# Patient Record
Sex: Female | Born: 1985 | Race: Black or African American | Hispanic: No | Marital: Single | Smoking: Current every day smoker
Health system: Southern US, Community
[De-identification: ages and names within clinical notes are randomized; demographics above are authoritative.]

## PROBLEM LIST (undated history)

## (undated) DIAGNOSIS — I10 Essential (primary) hypertension: Secondary | ICD-10-CM

## (undated) HISTORY — PX: NO PAST SURGERIES: SHX2092

---

## 2015-09-12 ENCOUNTER — Other Ambulatory Visit (INDEPENDENT_AMBULATORY_CARE_PROVIDER_SITE_OTHER): Payer: Self-pay | Admitting: Otolaryngology

## 2015-09-12 DIAGNOSIS — J0101 Acute recurrent maxillary sinusitis: Secondary | ICD-10-CM

## 2015-09-18 ENCOUNTER — Ambulatory Visit
Admission: RE | Admit: 2015-09-18 | Discharge: 2015-09-18 | Disposition: A | Discharge status: Home / Self Care | Payer: 59 | Source: Ambulatory Visit | Attending: Otolaryngology | Admitting: Otolaryngology

## 2015-09-18 DIAGNOSIS — J0101 Acute recurrent maxillary sinusitis: Secondary | ICD-10-CM

## 2015-10-08 ENCOUNTER — Emergency Department (HOSPITAL_COMMUNITY)
Admission: EM | Admit: 2015-10-08 | Discharge: 2015-10-08 | Disposition: A | Discharge status: Home / Self Care | Payer: 59 | Attending: Emergency Medicine | Admitting: Emergency Medicine

## 2015-10-08 ENCOUNTER — Emergency Department (HOSPITAL_COMMUNITY): Payer: 59

## 2015-10-08 ENCOUNTER — Encounter (HOSPITAL_COMMUNITY): Payer: Self-pay | Admitting: Emergency Medicine

## 2015-10-08 DIAGNOSIS — R079 Chest pain, unspecified: Secondary | ICD-10-CM | POA: Diagnosis present

## 2015-10-08 DIAGNOSIS — Z3202 Encounter for pregnancy test, result negative: Secondary | ICD-10-CM | POA: Insufficient documentation

## 2015-10-08 DIAGNOSIS — Z7982 Long term (current) use of aspirin: Secondary | ICD-10-CM | POA: Diagnosis not present

## 2015-10-08 DIAGNOSIS — J189 Pneumonia, unspecified organism: Secondary | ICD-10-CM

## 2015-10-08 DIAGNOSIS — I10 Essential (primary) hypertension: Secondary | ICD-10-CM | POA: Insufficient documentation

## 2015-10-08 DIAGNOSIS — Z87891 Personal history of nicotine dependence: Secondary | ICD-10-CM | POA: Insufficient documentation

## 2015-10-08 DIAGNOSIS — J159 Unspecified bacterial pneumonia: Secondary | ICD-10-CM | POA: Diagnosis not present

## 2015-10-08 HISTORY — DX: Essential (primary) hypertension: I10

## 2015-10-08 LAB — URINALYSIS, ROUTINE W REFLEX MICROSCOPIC
Bilirubin Urine: NEGATIVE
Glucose, UA: NEGATIVE mg/dL
Ketones, ur: NEGATIVE mg/dL
Leukocytes, UA: NEGATIVE
Nitrite: NEGATIVE
Protein, ur: NEGATIVE mg/dL
Specific Gravity, Urine: 1.021 (ref 1.005–1.030)
pH: 6 (ref 5.0–8.0)

## 2015-10-08 LAB — BASIC METABOLIC PANEL
Anion gap: 8 (ref 5–15)
BUN: 12 mg/dL (ref 6–20)
CALCIUM: 9.5 mg/dL (ref 8.9–10.3)
CO2: 26 mmol/L (ref 22–32)
CREATININE: 0.82 mg/dL (ref 0.44–1.00)
Chloride: 107 mmol/L (ref 101–111)
GFR calc Af Amer: >60 mL/min (ref >60)
GFR calc non Af Amer: >60 mL/min (ref >60)
GLUCOSE: 103 mg/dL — AB (ref 65–99)
Potassium: 4 mmol/L (ref 3.5–5.1)
Sodium: 141 mmol/L (ref 135–145)

## 2015-10-08 LAB — URINE MICROSCOPIC-ADD ON

## 2015-10-08 LAB — CBC
HCT: 38.4 % (ref 36.0–46.0)
Hemoglobin: 12.9 g/dL (ref 12.0–15.0)
MCH: 28.9 pg (ref 26.0–34.0)
MCHC: 33.6 g/dL (ref 30.0–36.0)
MCV: 85.9 fL (ref 78.0–100.0)
PLATELETS: 327 10*3/uL (ref 150–400)
RBC: 4.47 MIL/uL (ref 3.87–5.11)
RDW: 12.8 % (ref 11.5–15.5)
WBC: 6.5 10*3/uL (ref 4.0–10.5)

## 2015-10-08 LAB — I-STAT TROPONIN, ED: TROPONIN I, POC: 0 ng/mL (ref 0.00–0.08)

## 2015-10-08 LAB — D-DIMER, QUANTITATIVE: D-Dimer, Quant: 0.31 ug/mL-FEU (ref 0.00–0.50)

## 2015-10-08 LAB — PREGNANCY, URINE: Preg Test, Ur: NEGATIVE

## 2015-10-08 MED ORDER — KETOROLAC TROMETHAMINE 30 MG/ML IJ SOLN
30.0000 mg | Freq: Once | INTRAMUSCULAR | Status: AC
  Administered 2015-10-08: 30 mg via INTRAVENOUS
  Filled 2015-10-08: qty 1

## 2015-10-08 MED ORDER — DEXTROSE 5 % IV SOLN
1.0000 g | Freq: Once | INTRAVENOUS | Status: AC
  Administered 2015-10-08: 1 g via INTRAVENOUS
  Filled 2015-10-08: qty 10

## 2015-10-08 MED ORDER — HYDROCODONE-ACETAMINOPHEN 5-325 MG PO TABS: 1.0000 | ORAL_TABLET | Freq: Four times a day (QID) | ORAL | Status: DC | PRN

## 2015-10-08 MED ORDER — AZITHROMYCIN 250 MG PO TABS: ORAL_TABLET | ORAL | Status: AC

## 2015-10-08 MED ORDER — SODIUM CHLORIDE 0.9 % IV BOLUS (SEPSIS)
1000.0000 mL | Freq: Once | INTRAVENOUS | Status: AC
  Administered 2015-10-08: 1000 mL via INTRAVENOUS

## 2015-10-08 NOTE — Discharge Instructions (Signed)
Return here as needed.  Follow-up with the resources provided. °

## 2015-10-08 NOTE — ED Notes (Signed)
Unable to collect lab at this time PT taken to xray

## 2015-10-08 NOTE — ED Notes (Signed)
Pt c/o hypertension, chest tightness, and headache, has been waking up with chest pains, had BP checked and saw it was 156/108.

## 2015-10-08 NOTE — Progress Notes (Signed)
Entered in d/c instructions  Please use the resources provided to you in emergency room by case manager to assist with doctor for follow up  Please verify any provider recommended to you is in network

## 2015-10-08 NOTE — Progress Notes (Addendum)
Pt states she has "just moved to the area" and without a pcp for follow up care Cm provided her with a list of accepting [employer] [facility] choice plus providers  [facility] ED CM spoke with pt on how to obtain an in network pcp with insurance coverage via the customer service number or web site  Cm reviewed ED level of care for crisis/emergent services and community pcp level of care to manage continuous or chronic medical concerns.  The pt voiced understanding CM encouraged pt and discussed pt's responsibility to verify with pt's insurance carrier that any recommended medical provider offered by any emergency room or a hospital provider is within the carrier's network. The pt voiced understanding     Provided pt with a 15 page list of providers in her new zip code Pt very appreciative

## 2015-10-08 NOTE — ED Provider Notes (Signed)
CSN: [id number]     Arrival date & time 10/08/15  1101 History   First MD Initiated Contact with Patient 10/08/15 1223     Chief Complaint  Patient presents with  . Hypertension  . Chest Pain  . Headache     (Consider location/radiation/quality/duration/timing/severity/associated sxs/prior Treatment) HPI Patient presents to the emergency department with 2 weeks' worth of chest discomfort intermittently along with headache.  The patient states that her blood pressure has been up for quite a while, but is unsure of how long patient states that nothing seems make the condition better.  She states that movement, coughing, laughing and palpation make the chest pain worse.  She states that it sharp in nature, does not radiate.  She states she does not have any diaphoresis, nausea, vomiting, weakness, dizziness, blurred vision, back pain, neck pain, fever, abdominal pain, dysuria, incontinence, fever, cough, near syncope or syncope Past Medical History  Diagnosis Date  . Hypertension    History reviewed. No pertinent past surgical history. History reviewed. No pertinent family history. Social History  Substance Use Topics  . Smoking status: Former Research scientist (life sciences)  . Smokeless tobacco: None  . Alcohol Use: Yes     Comment: occasional   OB History    No data available     Review of Systems  All other systems negative except as documented in the HPI. All pertinent positives and negatives as reviewed in the HPI.  Allergies  Review of patient's allergies indicates no known allergies.  Home Medications   Prior to Admission medications   Medication Sig Start Date End Date Taking? Authorizing Provider  aspirin EC 81 MG tablet Take 81 mg by mouth daily as needed for mild pain.   Yes Historical Provider, MD  levonorgestrel (MIRENA, 52 MG,) 20 MCG/24HR IUD 20 mcg by Intrauterine route continuous.   Yes Historical Provider, MD  naproxen (NAPROSYN) 500 MG tablet Take 500 mg by mouth 2 (two) times  daily as needed for mild pain or moderate pain.  09/08/15  Yes Historical Provider, MD   BP 150/106 mmHg  Pulse 68  Resp 18  SpO2 100%  LMP 10/06/2015 Physical Exam  Constitutional: She is oriented to person, place, and time. She appears well-developed and well-nourished. No distress.  HENT:  Head: Normocephalic and atraumatic.  Mouth/Throat: Oropharynx is clear and moist.  Eyes: Pupils are equal, round, and reactive to light.  Neck: Normal range of motion. Neck supple.  Cardiovascular: Normal rate, regular rhythm and normal heart sounds.  Exam reveals no gallop and no friction rub.   No murmur heard. Pulmonary/Chest: Effort normal and breath sounds normal. No respiratory distress. She has no wheezes.  Abdominal: Soft. Bowel sounds are normal. She exhibits no distension. There is no tenderness.  Neurological: She is alert and oriented to person, place, and time. She exhibits normal muscle tone. Coordination normal.  Skin: Skin is warm and dry. No rash noted. No erythema.  Psychiatric: She has a normal mood and affect. Her behavior is normal.  Nursing note and vitals reviewed.   ED Course  Procedures (including critical care time) Labs Review Labs Reviewed  BASIC METABOLIC PANEL - Abnormal; Notable for the following:    Glucose, Bld 103 (*)    All other components within normal limits  URINALYSIS, ROUTINE W REFLEX MICROSCOPIC (NOT AT [facility]) - Abnormal; Notable for the following:    Hgb urine dipstick LARGE (*)    All other components within normal limits  URINE MICROSCOPIC-ADD ON - Abnormal;  Notable for the following:    Squamous Epithelial / LPF 0-5 (*)    Bacteria, UA FEW (*)    All other components within normal limits  CBC  PREGNANCY, URINE  D-DIMER, QUANTITATIVE (NOT AT [facility])  Randolm Idol, ED    Imaging Review Dg Chest 2 View  10/08/2015  CLINICAL DATA:  Chest tightness and headache. EXAM: CHEST  2 VIEW COMPARISON:  None. FINDINGS: There is mild hazy left lower  lobe airspace disease. There is no pleural effusion or pneumothorax. The heart and mediastinal contours are unremarkable. The osseous structures are unremarkable. IMPRESSION: Hazy left lower lobe airspace disease which may reflect atelectasis versus pneumonia. Electronically Signed   By: Kathreen Devoid   On: 10/08/2015 12:05   I have personally reviewed and evaluated these images and lab results as part of my medical decision-making.  Patient be treated for community acquired pneumonia told to return here as needed.  Told to return here as needed.  Patient agrees the plan and all questions were answered    Dalia Heading, PA-C 10/08/15 [place], MD 10/09/15 [phone]

## 2016-05-19 ENCOUNTER — Ambulatory Visit (INDEPENDENT_AMBULATORY_CARE_PROVIDER_SITE_OTHER): Payer: 59 | Admitting: Allergy & Immunology

## 2016-05-19 ENCOUNTER — Encounter: Payer: Self-pay | Admitting: Allergy & Immunology

## 2016-05-19 ENCOUNTER — Encounter (INDEPENDENT_AMBULATORY_CARE_PROVIDER_SITE_OTHER): Payer: Self-pay

## 2016-05-19 VITALS — BP 130/75 | HR 75 | Temp 98.1°F | Resp 16 | Ht 64.96 in | Wt 236.8 lb

## 2016-05-19 DIAGNOSIS — J4530 Mild persistent asthma, uncomplicated: Secondary | ICD-10-CM

## 2016-05-19 DIAGNOSIS — J3089 Other allergic rhinitis: Secondary | ICD-10-CM

## 2016-05-19 DIAGNOSIS — T781XXA Other adverse food reactions, not elsewhere classified, initial encounter: Secondary | ICD-10-CM

## 2016-05-19 NOTE — Patient Instructions (Addendum)
1. Shortness of breath - Lung testing today was normal but you did respond to albuterol. - I recommend starting a daily inhaled steroid Qvar 84mcg (two puffs in the morning and two puffs at night). - Use a spacer every time.  - Use albuterol 4 puffs every 4-6 hours as needed for coughing/wheezing.  2. Pollen-food allergy syndrome, initial encounter - Continue to avoid all fruits that trigger symptoms.  - EpiPen refilled.  3. Chronic nonseasonal allergic rhinitis due to pollen - Testing was positive to grasses, trees, weeds, molds, dust mite, cat, dog, and cockroach. - Avoidance measures discussed. - Start Dymista 2 sprays per nostril 1-2 times daily. - Start Singulair 10mg  daily. - Continue Xyzal 5mg  daily. - We will start allergy shots. - Make an appointment in two weeks for the first injection.  4. Return in about 2 months (around 07/19/2016).  Please inform us of any Emergency Department visits, hospitalizations, or changes in symptoms. Call us before going to the ED for breathing or allergy symptoms since we might be able to fit you in for a sick visit. Feel free to contact us anytime with any questions, problems, or concerns.  It was a pleasure to meet you today! Good luck with calculus!   Websites that have reliable patient information: 1. American Academy of Asthma, Allergy, and Immunology: www.aaaai.org 2. Food Allergy Research and Education (FARE): foodallergy.org 3. Mothers of Asthmatics: http://www.asthmacommunitynetwork.org 4. American College of Allergy, Asthma, and Immunology: www.acaai.org  Control of House Dust Mite Allergen    House dust mites play a major role in allergic asthma and rhinitis.  They occur in environments with high humidity wherever human skin, the food for dust mites is found. High levels have been detected in dust obtained from mattresses, pillows, carpets, upholstered furniture, bed covers, clothes and soft toys.  The principal allergen of the  house dust mite is found in its feces.  A gram of dust may contain 1,000 mites and 250,000 fecal particles.  Mite antigen is easily measured in the air during house cleaning activities.    1. Encase mattresses, including the box spring, and pillow, in an air tight cover.  Seal the zipper end of the encased mattresses with wide adhesive tape. 2. Wash the bedding in water of 130 degrees Farenheit weekly.  Avoid cotton comforters/quilts and flannel bedding: the most ideal bed covering is the dacron comforter. 3. Remove all upholstered furniture from the bedroom. 4. Remove carpets, carpet padding, rugs, and non-washable window drapes from the bedroom.  Wash drapes weekly or use plastic window coverings. 5. Remove all non-washable stuffed toys from the bedroom.  Wash stuffed toys weekly. 6. Have the room cleaned frequently with a vacuum cleaner and a damp dust-mop.  The patient should not be in a room which is being cleaned and should wait 1 hour after cleaning before going into the room. 7. Close and seal all heating outlets in the bedroom.  Otherwise, the room will become filled with dust-laden air.  An electric heater can be used to heat the room. 8. Reduce indoor humidity to less than 50%.  Do not use a humidifier.  Reducing Pollen Exposure  The American Academy of Allergy, Asthma and Immunology suggests the following steps to reduce your exposure to pollen during allergy seasons.    1. Do not hang sheets or clothing out to dry; pollen may collect on these items. 2. Do not mow lawns or spend time around freshly cut grass; mowing stirs up pollen. 3.  Keep windows closed at night.  Keep car windows closed while driving. 4. Minimize morning activities outdoors, a time when pollen counts are usually at their highest. 5. Stay indoors as much as possible when pollen counts or humidity is high and on windy days when pollen tends to remain in the air longer. 6. Use air conditioning when possible.  Many air  conditioners have filters that trap the pollen spores. 7. Use a HEPA room air filter to remove pollen form the indoor air you breathe.  Control of Cockroach Allergen  Cockroach allergen has been identified as an important cause of acute attacks of asthma, especially in urban settings.  There are fifty-five species of cockroach that exist in the [place], however only three, the [place], [place] species produce allergen that can affect patients with Asthma.  Allergens can be obtained from fecal particles, egg casings and secretions from cockroaches.    1. Remove food sources. 2. Reduce access to water. 3. Seal access and entry points. 4. Spray runways with 0.5-1% Diazinon or Chlorpyrifos 5. Blow boric acid power under stoves and refrigerator. 6. Place bait stations (hydramethylnon) at feeding sites.  Control of Mold Allergen  Mold and fungi can grow on a variety of surfaces provided certain temperature and moisture conditions exist.  Outdoor molds grow on plants, decaying vegetation and soil.  The major outdoor mold, Alternaria and Cladosporium, are found in very high numbers during hot and dry conditions.  Generally, a late Summer - Fall peak is seen for common outdoor fungal spores.  Rain will temporarily lower outdoor mold spore count, but counts rise rapidly when the rainy period ends.  The most important indoor molds are Aspergillus and Penicillium.  Dark, humid and poorly ventilated basements are ideal sites for mold growth.  The next most common sites of mold growth are the bathroom and the kitchen.  Outdoor [employer] 1. Use air conditioning and keep windows closed 2. Avoid exposure to decaying vegetation. 3. Avoid leaf raking. 4. Avoid grain handling. 5. Consider wearing a face mask if working in moldy areas.  Indoor Mold Control 1. Maintain humidity below 50%. 2. Clean washable surfaces with 5% bleach solution. 3. Remove sources e.g. contaminated  carpets.  Control of Dog or Cat Allergen  Avoidance is the best way to manage a dog or cat allergy. If you have a dog or cat and are allergic to dog or cats, consider removing the dog or cat from the home. If you have a dog or cat but don't want to find it a new home, or if your family wants a pet even though someone in the household is allergic, here are some strategies that may help keep symptoms at bay:  1. Keep the pet out of your bedroom and restrict it to only a few rooms. Be advised that keeping the dog or cat in only one room will not limit the allergens to that room. 2. Don't pet, hug or kiss the dog or cat; if you do, wash your hands with soap and water. 3. High-efficiency particulate air (HEPA) cleaners run continuously in a bedroom or living room can reduce allergen levels over time. 4. Regular use of a high-efficiency vacuum cleaner or a central vacuum can reduce allergen levels. 5. Giving your dog or cat a bath at least once a week can reduce airborne allergen.

## 2016-05-19 NOTE — Progress Notes (Signed)
NEW PATIENT  Date of Service/Encounter:  05/20/16   Assessment:   Mild persistent asthma without complication - Plan: Spirometry with Graph  Pollen-food allergy syndrome, initial encounter  Chronic nonseasonal allergic rhinitis due to pollen - Plan: Allergy Test, Interdermal Allergy Test    Asthma Reportables:  Severity: mild persistent  Risk: low Control: not well controlled  Seasonal Influenza Vaccine: no but encouraged    Plan/Recommendations:   1. Shortness of breath - Lung testing today was normal but Ms. Standlee did respond to albuterol with marked improvement in the FEV1 and FVC (significant per ATS criteria). - I recommend starting a daily inhaled steroid Qvar 81mg (two puffs in the morning and two puffs at night). - Spacer and teaching provided.  - Use albuterol 4 puffs every 4-6 hours as needed for coughing/wheezing.  2. Pollen-food allergy syndrome, initial encounter - Continue to avoid all fruits that trigger symptoms.  - EpiPen refilled and teaching provided.   3. Chronic nonseasonal allergic rhinitis due to pollen - Testing was positive to grasses, trees, weeds, molds, dust mite, cat, dog, and cockroach. - Avoidance measures discussed. - Start Dymista 2 sprays per nostril 1-2 times daily. - Start Singulair 19mdaily. - Continue Xyzal 54m72maily. - We will start allergy shots and the vials will be ready in a couple of weeks. - Ms. JenChilcotell make an appointment in two weeks for the first injection.  4. Return in about 2 months (around 07/19/2016).   Subjective:   AliTavaria Mackins a [age]o. female presenting today for evaluation of  Chief Complaint  Patient presents with  . Allergy Testing    patient was on allergy injection and moved her and wants to restart her injection.  .  Tommi Rumpss a history of the following: Patient Active Problem List   Diagnosis Date Noted  . Mild persistent asthma without complication 05/23/24/8527  Pollen-food allergy syndrome, initial encounter 05/20/2016  . Chronic nonseasonal allergic rhinitis due to pollen 05/20/2016    History obtained from: chart review and patient.  AliLynanne Delgrecos referred by KatJudithann SheenD.     AliZaya a [age]o. female presenting for an allergy evaluation. She was previously on allergen immunotherapy and [place] [facility] to her transitioning to [employer] her job in August 2016. She is interested today in restarting her allergen immunotherapy.   AliShakemias had symptoms of allergic rhinitis since she was very young. Her symptoms are centered mostly around nasal congestion alternating with rhinorrhea. She does endorse some ocular symptoms. Symptoms occur throughout the year, and seemed to worsen around animals including cats and dogs. Currently, she is treating her symptoms only with Xyzal. She has been on multiple nasal steroids in the past without resolution. She has never been on nasal antihistamines. She has never perform nasal saline rinses routinely. She endorses having 2-3 sinus infections per year, which are debilitating and require missing work.  [place] last August 2016 [place] [facility] was started on immunotherapy in April 2016 and received them for only two months before started to pack and move. She does feel like they helped. She had skin testing performed in April 2016 and was allergic to multiple allergens. AliPalmerso has a history of nasal polyposis and is followed by Dr. TeoBenjamine Molae was planning to perform a polypectomy in May 2017. However, AliMazzynceled the  procedure because she and Dr.Teoh wanted to get her allergies under control first to decrease the chances that her nasal polyps will return following the polypectomy.  She has never been diagnosed with asthma. She does endorse SOB this morning, but smokes twice daily and  knows that she is out of shape. She was on an inhaler years ago but has not needed one recently. She denies daytime coughing and wheezing, nighttime coughing, or breathing difficulties. She does endorse some shortness of breath especially with physical activity, but attributes this to being out of shape. She did have prednisone in early 2017 for her polyps but otherwise she has had no prednisone. She has never been to the ED for breathing. 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 She did have an EpiPen at some point. Testing for bananas and pineapples was negative at her appointment with [facility], according to the patient.  Otherwise, there is no history of other atopic diseases, including drug allergies, stinging insect allergies, or urticaria. She does have eczema on her back during the season changes, otherwise no eczema. There is no significant infectious history. Vaccinations are up to date.    Past Medical History: Patient Active Problem List   Diagnosis Date Noted  . Mild persistent asthma without complication 67/34/1936  . Pollen-food allergy syndrome, initial encounter 05/20/2016  . Chronic nonseasonal allergic rhinitis due to pollen 05/20/2016    Medication List:    Medication List       Accurate as of 05/19/16 11:59 PM. Always use your most recent med list.          ARIPiprazole 15 MG tablet Commonly known as:  ABILIFY   aspirin EC 81 MG tablet Take 81 mg by mouth daily as needed for mild pain.   azithromycin 250 MG tablet Commonly known as:  ZITHROMAX 2 PO day 1 then 1 PO day 2-5   BALZIVA 0.4-35 MG-MCG tablet Generic drug:  norethindrone-ethinyl estradiol   hydrOXYzine 25 MG tablet Commonly known as:  ATARAX/VISTARIL       Birth History: non-contributory. Born at term without complications.   Developmental History: Annaleese has met all milestones on time. She has required no speech therapy, occupational therapy, or  physical therapy.   Past Surgical History: Past Surgical History:  Procedure Laterality Date  . NO PAST SURGERIES       Family History: Family History  Problem Relation Age of Onset  . Asthma Father   . Asthma Sister   . Allergic rhinitis Brother   . Angioedema Neg Hx   . Atopy Neg Hx   . Immunodeficiency Neg Hx   . Urticaria Neg Hx   . Eczema Neg Hx      Social History: [place] lives at home by herself. Isadore lives in an apartment that is 16-year-old. There is carpeting throughout the home. She has electric heating and central cooling. There are no animals inside or outside the home, but her neighbor does have a dog. She does have dust mite covers on her bed but not her pillow. She does smoke two cigarettes per day.   Review of Systems: a 14-point review of systems is pertinent for what is mentioned in HPI.  Otherwise, all other systems were negative. Constitutional: negative other than that listed in the HPI Eyes: negative other than that listed in the HPI Ears, nose, mouth, throat, and face: negative other than that listed in the HPI Respiratory: negative other than that listed in the  HPI Cardiovascular: negative other than that listed in the HPI Gastrointestinal: negative other than that listed in the HPI Genitourinary: negative other than that listed in the HPI Integument: negative other than that listed in the HPI Hematologic: negative other than that listed in the HPI Musculoskeletal: negative other than that listed in the HPI Neurological: negative other than that listed in the HPI Allergy/Immunologic: negative other than that listed in the HPI    Objective:   Blood pressure 130/75, pulse 75, temperature 98.1 F (36.7 C), temperature source Oral, resp. rate 16, height 5' 4.96" (1.65 m), weight 236 lb 12.8 oz (107.4 kg). Body mass index is 39.45 kg/m.   Physical Exam:  General: Alert, interactive, in no acute distress. Pleasant female. Cooperative with the  exam HEENT: TMs pearly gray, turbinates markedly edematous and pale with clear discharge, post-pharynx markedly erythematous. Neck: Supple without thyromegaly. Adenopathy: no enlarged lymph nodes appreciated in the anterior cervical, occipital, axillary, epitrochlear, inguinal, or popliteal regions Lungs: Clear to auscultation without wheezing, rhonchi or rales. No increased work of breathing. CV: Physiologic splitting of S1/S2, no murmurs. Capillary refill <2 seconds.  Abdomen: Nondistended, nontender. No guarding or rebound tenderness. Bowel sounds absent  Skin: Warm and dry, without lesions or rashes. Extremities:  No clubbing, cyanosis or edema. Neuro:   Grossly intact. No deficits noted.   Diagnostic studies:  Spirometry: results normal (FEV1: 2.26/85%, FVC: 2.74/89%, FEV1/FVC: 82%).    Spirometry consistent with normal pattern. We did give an albuterol nebulizer treatment with significant improvement in both the FEV1 (268m/12%) and the FVC (3852m14%). This is significant per ATS criteria.   Allergy Studies:   Indoor/Outdoor Percutaneous Adult Environmental Panel: positive to grasses, weeds, trees, molds, dust mite, and cockroach  Indoor/Outdoor Selected Intradermal Environmental Panel: positive to cats and dogs      JoSalvatore MarvelMD [facility]

## 2016-05-20 DIAGNOSIS — T781XXA Other adverse food reactions, not elsewhere classified, initial encounter: Secondary | ICD-10-CM | POA: Insufficient documentation

## 2016-05-20 DIAGNOSIS — J4530 Mild persistent asthma, uncomplicated: Secondary | ICD-10-CM | POA: Insufficient documentation

## 2016-05-20 DIAGNOSIS — J3089 Other allergic rhinitis: Secondary | ICD-10-CM | POA: Insufficient documentation

## 2016-05-20 MED ORDER — AZELASTINE-FLUTICASONE 137-50 MCG/ACT NA SUSP: 2.0000 | Freq: Every day | NASAL | 5 refills | Status: AC | PRN

## 2016-05-20 MED ORDER — BECLOMETHASONE DIPROPIONATE 40 MCG/ACT IN AERS: 2.0000 | INHALATION_SPRAY | Freq: Two times a day (BID) | RESPIRATORY_TRACT | 5 refills | Status: AC

## 2016-05-20 MED ORDER — ALBUTEROL SULFATE HFA 108 (90 BASE) MCG/ACT IN AERS
2.0000 | INHALATION_SPRAY | RESPIRATORY_TRACT | 3 refills | Status: AC | PRN
Start: 2016-05-20 — End: ?

## 2016-05-20 MED ORDER — MONTELUKAST SODIUM 10 MG PO TABS: 10.0000 mg | ORAL_TABLET | Freq: Every day | ORAL | 5 refills | Status: AC

## 2016-05-20 NOTE — Addendum Note (Signed)
Addended by: York Grice on: 05/20/2016 09:04 AM   Modules accepted: Orders

## 2016-05-21 NOTE — Progress Notes (Signed)
Vials to be made.  Jm

## 2016-05-22 ENCOUNTER — Encounter: Payer: Self-pay | Admitting: *Deleted

## 2016-05-22 DIAGNOSIS — J3081 Allergic rhinitis due to animal (cat) (dog) hair and dander: Secondary | ICD-10-CM | POA: Diagnosis not present

## 2016-05-23 DIAGNOSIS — J3089 Other allergic rhinitis: Secondary | ICD-10-CM | POA: Diagnosis not present

## 2016-06-09 ENCOUNTER — Ambulatory Visit (INDEPENDENT_AMBULATORY_CARE_PROVIDER_SITE_OTHER): Payer: 59 | Admitting: *Deleted

## 2016-06-09 ENCOUNTER — Ambulatory Visit: Payer: 59

## 2016-06-09 DIAGNOSIS — J309 Allergic rhinitis, unspecified: Secondary | ICD-10-CM | POA: Diagnosis not present

## 2016-06-09 NOTE — Progress Notes (Signed)
Immunotherapy   Patient Details  Name: Mariangela Lanthier MRN: [id number] Date of Birth: [id number]  Tommi Rumps started injections for  MOLD-DM-CR/ Following schedule: A  Frequency:2 times per week Epi-Pen:Epi-Pen Available  Consent signed and patient instructions given.   Orlene Erm 06/09/2016, 3:50 PM

## 2016-06-16 ENCOUNTER — Encounter (HOSPITAL_COMMUNITY): Payer: Self-pay

## 2016-06-16 DIAGNOSIS — I10 Essential (primary) hypertension: Secondary | ICD-10-CM | POA: Diagnosis not present

## 2016-06-16 DIAGNOSIS — F17210 Nicotine dependence, cigarettes, uncomplicated: Secondary | ICD-10-CM | POA: Diagnosis not present

## 2016-06-16 DIAGNOSIS — B9689 Other specified bacterial agents as the cause of diseases classified elsewhere: Secondary | ICD-10-CM | POA: Insufficient documentation

## 2016-06-16 DIAGNOSIS — J45909 Unspecified asthma, uncomplicated: Secondary | ICD-10-CM | POA: Insufficient documentation

## 2016-06-16 DIAGNOSIS — R102 Pelvic and perineal pain: Secondary | ICD-10-CM | POA: Diagnosis present

## 2016-06-16 DIAGNOSIS — D259 Leiomyoma of uterus, unspecified: Secondary | ICD-10-CM | POA: Insufficient documentation

## 2016-06-16 DIAGNOSIS — Z7982 Long term (current) use of aspirin: Secondary | ICD-10-CM | POA: Diagnosis not present

## 2016-06-16 DIAGNOSIS — N760 Acute vaginitis: Secondary | ICD-10-CM | POA: Diagnosis not present

## 2016-06-16 DIAGNOSIS — Z79899 Other long term (current) drug therapy: Secondary | ICD-10-CM | POA: Insufficient documentation

## 2016-06-16 DIAGNOSIS — N83292 Other ovarian cyst, left side: Secondary | ICD-10-CM | POA: Insufficient documentation

## 2016-06-16 NOTE — ED Triage Notes (Signed)
Pt states she started having vaginal pain over a week ago; pt states " I feel like someone ran a pole from my anus to my vagina"; Pt denies discharge or urinary symptoms; pt  States pain is at 6/10 on arrival. Pt states pain is sharp, stabbing, pressure; Pt a&ox 4 on arrival.

## 2016-06-17 ENCOUNTER — Emergency Department (HOSPITAL_COMMUNITY): Payer: 59

## 2016-06-17 ENCOUNTER — Emergency Department (HOSPITAL_COMMUNITY)
Admission: EM | Admit: 2016-06-17 | Discharge: 2016-06-17 | Disposition: A | Discharge status: Home / Self Care | Payer: 59 | Attending: Emergency Medicine | Admitting: Emergency Medicine

## 2016-06-17 DIAGNOSIS — B9689 Other specified bacterial agents as the cause of diseases classified elsewhere: Secondary | ICD-10-CM

## 2016-06-17 DIAGNOSIS — N83202 Unspecified ovarian cyst, left side: Secondary | ICD-10-CM

## 2016-06-17 DIAGNOSIS — N760 Acute vaginitis: Secondary | ICD-10-CM

## 2016-06-17 DIAGNOSIS — D259 Leiomyoma of uterus, unspecified: Secondary | ICD-10-CM

## 2016-06-17 LAB — URINALYSIS, ROUTINE W REFLEX MICROSCOPIC
Bilirubin Urine: NEGATIVE
Glucose, UA: NEGATIVE mg/dL
Ketones, ur: NEGATIVE mg/dL
LEUKOCYTES UA: NEGATIVE
NITRITE: NEGATIVE
PROTEIN: NEGATIVE mg/dL
SPECIFIC GRAVITY, URINE: 1.021 (ref 1.005–1.030)
pH: 7 (ref 5.0–8.0)

## 2016-06-17 LAB — URINE MICROSCOPIC-ADD ON

## 2016-06-17 LAB — WET PREP, GENITAL
SPERM: NONE SEEN
TRICH WET PREP: NONE SEEN
Yeast Wet Prep HPF POC: NONE SEEN

## 2016-06-17 LAB — GC/CHLAMYDIA PROBE AMP (~~LOC~~) NOT AT ARMC
CHLAMYDIA, DNA PROBE: NEGATIVE
Neisseria Gonorrhea: NEGATIVE

## 2016-06-17 LAB — POC URINE PREG, ED: Preg Test, Ur: NEGATIVE

## 2016-06-17 MED ORDER — AZITHROMYCIN 250 MG PO TABS
1000.0000 mg | ORAL_TABLET | Freq: Once | ORAL | Status: AC
  Administered 2016-06-17: 1000 mg via ORAL
  Filled 2016-06-17: qty 4

## 2016-06-17 MED ORDER — CEFTRIAXONE SODIUM 250 MG IJ SOLR
250.0000 mg | Freq: Once | INTRAMUSCULAR | Status: AC
  Administered 2016-06-17: 250 mg via INTRAMUSCULAR
  Filled 2016-06-17: qty 250

## 2016-06-17 MED ORDER — OXYCODONE-ACETAMINOPHEN 5-325 MG PO TABS
1.0000 | ORAL_TABLET | Freq: Once | ORAL | Status: AC
  Administered 2016-06-17: 1 via ORAL
  Filled 2016-06-17: qty 1

## 2016-06-17 MED ORDER — ONDANSETRON 4 MG PO TBDP
4.0000 mg | ORAL_TABLET | Freq: Once | ORAL | Status: AC
  Administered 2016-06-17: 4 mg via ORAL
  Filled 2016-06-17: qty 1

## 2016-06-17 MED ORDER — METRONIDAZOLE 500 MG PO TABS: 500.0000 mg | ORAL_TABLET | Freq: Two times a day (BID) | ORAL | 0 refills | Status: AC

## 2016-06-17 MED ORDER — TRAMADOL HCL 50 MG PO TABS: 50.0000 mg | ORAL_TABLET | Freq: Four times a day (QID) | ORAL | 0 refills | Status: AC | PRN

## 2016-06-17 NOTE — ED Provider Notes (Signed)
[facility] DEPT Provider Note   CSN: [id number] Arrival date & time: 06/16/16  2332  History   Chief Complaint Chief Complaint  Patient presents with  . Vaginal Pain   HPI Tiffany Levy is a 30 y.o. female.  HPI   Patient to the ER with PMH of hypertension, ovarian cysts and fibroids for evaluation of pelvic pain. It has been bothering her for 1 month off and on. She is currently on her menstrual cycle. She describes it has a sharp, stabbing pain from her vagina to her rectum. She says the pain got so severe this evening that she was unable to tolerate it any further and made the decision to come to the ED for evaluation. 6/10 pain. No abdominal pain, CP, LE swelling, SOB, n/v/d, fevers, back pains, denies vaginal discharge. Last visit to gynecologist was in October to have her Mirena removed. Denies any complications or concerns at that time.  Past Medical History:  Diagnosis Date  . Hypertension     Patient Active Problem List   Diagnosis Date Noted  . Mild persistent asthma without complication [id number]  . Pollen-food allergy syndrome, initial encounter 05/20/2016  . Chronic nonseasonal allergic rhinitis due to pollen 05/20/2016    Past Surgical History:  Procedure Laterality Date  . NO PAST SURGERIES      OB History    No data available       Home Medications    Prior to Admission medications   Medication Sig Start Date End Date Taking? Authorizing Provider  albuterol (PROAIR HFA) 108 (90 Base) MCG/ACT inhaler Inhale 2 puffs into the lungs every 4 (four) hours as needed for wheezing or shortness of breath. 05/20/16  Yes Valentina Shaggy, MD  ARIPiprazole (ABILIFY) 15 MG tablet Take 15 mg by mouth daily.  05/01/16  Yes Historical Provider, MD  aspirin EC 81 MG tablet Take 81 mg by mouth daily as needed for mild pain.   Yes Historical Provider, MD  Azelastine-Fluticasone (DYMISTA) 137-50 MCG/ACT SUSP Place 2 sprays into both nostrils daily as needed.  05/20/16  Yes Valentina Shaggy, MD  Hulda Humphrey 0.4-35 MG-MCG tablet Take 1 tablet by mouth daily.  04/27/16  Yes Historical Provider, MD  beclomethasone (QVAR) 40 MCG/ACT inhaler Inhale 2 puffs into the lungs 2 (two) times daily. 05/20/16  Yes Valentina Shaggy, MD  hydrOXYzine (ATARAX/VISTARIL) 25 MG tablet Take 25 mg by mouth every 6 (six) hours as needed for anxiety.  05/01/16  Yes Historical Provider, MD  montelukast (SINGULAIR) 10 MG tablet Take 1 tablet (10 mg total) by mouth at bedtime. 05/20/16  Yes Valentina Shaggy, MD  azithromycin (ZITHROMAX) 250 MG tablet 2 PO day 1 then 1 PO day 2-5 Patient not taking: Reported on 06/17/2016 10/08/15   Dalia Heading, PA-C  metroNIDAZOLE (FLAGYL) 500 MG tablet Take 1 tablet (500 mg total) by mouth 2 (two) times daily. 06/17/16   Kaz Auld Carlota Raspberry, PA-C  traMADol (ULTRAM) 50 MG tablet Take 1 tablet (50 mg total) by mouth every 6 (six) hours as needed. 06/17/16   Delos Haring, PA-C    Family History Family History  Problem Relation Age of Onset  . Asthma Father   . Asthma Sister   . Allergic rhinitis Brother   . Angioedema Neg Hx   . Atopy Neg Hx   . Immunodeficiency Neg Hx   . Urticaria Neg Hx   . Eczema Neg Hx     Social History Social History  Substance Use Topics  .  Smoking status: Current Every Day Smoker    Types: Cigarettes  . Smokeless tobacco: Current User  . Alcohol use Yes     Comment: occasional     Allergies   Patient has no known allergies.   Review of Systems Review of Systems Review of Systems All other systems negative except as documented in the HPI. All pertinent positives and negatives as reviewed in the HPI.   Physical Exam Updated Vital Signs BP 147/95 (BP Location: Right Arm)   Pulse 88   Temp 97.9 F (36.6 C) (Oral)   Resp 18   Ht 5\' 3"  (1.6 m)   Wt 120.2 kg   LMP 06/14/2016   SpO2 98%   BMI 46.94 kg/m   Physical Exam  Constitutional: She appears well-developed and well-nourished. No  distress.  HENT:  Head: Normocephalic and atraumatic.  Eyes: Pupils are equal, round, and reactive to light.  Neck: Normal range of motion. Neck supple.  Cardiovascular: Normal rate and regular rhythm.   Pulmonary/Chest: Effort normal.  Abdominal: Soft. Bowel sounds are normal. She exhibits no distension. There is tenderness in the suprapubic area.  Exam limited by body habitus  Genitourinary: There is no rash or tenderness on the right labia. There is no rash or tenderness on the left labia. Cervix exhibits no motion tenderness, no discharge and no friability. Right adnexum displays no mass and no tenderness. Left adnexum displays no mass and no tenderness. There is bleeding in the vagina.  Genitourinary Comments: Large amount of blood within vaginal vault. No obvious signs of discharge.  Neurological: She is alert.  Skin: Skin is warm and dry.  Nursing note and vitals reviewed.  ED Treatments / Results  Labs (all labs ordered are listed, but only abnormal results are displayed) Labs Reviewed  WET PREP, GENITAL - Abnormal; Notable for the following:       Result Value   Clue Cells Wet Prep HPF POC PRESENT (*)    WBC, Wet Prep HPF POC MANY (*)    All other components within normal limits  URINALYSIS, ROUTINE W REFLEX MICROSCOPIC (NOT AT [facility]) - Abnormal; Notable for the following:    APPearance CLOUDY (*)    Hgb urine dipstick LARGE (*)    All other components within normal limits  URINE MICROSCOPIC-ADD ON - Abnormal; Notable for the following:    Squamous Epithelial / LPF 0-5 (*)    Bacteria, UA RARE (*)    All other components within normal limits  POC URINE PREG, ED  GC/CHLAMYDIA PROBE AMP ([place]) NOT AT [facility]    EKG  EKG Interpretation None       Radiology US Transvaginal Non-ob  Result Date: 06/17/2016 CLINICAL DATA:  Acute pelvic pain. EXAM: TRANSABDOMINAL AND TRANSVAGINAL ULTRASOUND OF PELVIS TECHNIQUE: Both transabdominal and transvaginal ultrasound  examinations of the pelvis were performed. Transabdominal technique was performed for global imaging of the pelvis including uterus, ovaries, adnexal regions, and pelvic cul-de-sac. It was necessary to proceed with endovaginal exam following the transabdominal exam to visualize the ovaries. COMPARISON:  None FINDINGS: Uterus Measurements: 8.4 x 5.2 x 5.6 cm. At least 2 fibroids, the largest measuring 2.5 cm in the posterior midline midbody. Endometrium Thickness: 2 mm.  No focal abnormality visualized. Right ovary Measurements: 4.6 x 2.4 x 4.3 cm. Normal appearance/no adnexal mass. Left ovary Measurements: 3.5 x 2.0 x 4.5 cm. 3 cm hemorrhagic cyst. Other findings No abnormal free fluid. IMPRESSION: Multi fibroid uterus. The largest fibroid measures 2.5 cm.  Hemorrhagic cyst of the left ovary. Electronically Signed   By: Andreas Newport M.D.   On: 06/17/2016 05:14   US Pelvis Complete  Result Date: 06/17/2016 CLINICAL DATA:  Acute pelvic pain. EXAM: TRANSABDOMINAL AND TRANSVAGINAL ULTRASOUND OF PELVIS TECHNIQUE: Both transabdominal and transvaginal ultrasound examinations of the pelvis were performed. Transabdominal technique was performed for global imaging of the pelvis including uterus, ovaries, adnexal regions, and pelvic cul-de-sac. It was necessary to proceed with endovaginal exam following the transabdominal exam to visualize the ovaries. COMPARISON:  None FINDINGS: Uterus Measurements: 8.4 x 5.2 x 5.6 cm. At least 2 fibroids, the largest measuring 2.5 cm in the posterior midline midbody. Endometrium Thickness: 2 mm.  No focal abnormality visualized. Right ovary Measurements: 4.6 x 2.4 x 4.3 cm. Normal appearance/no adnexal mass. Left ovary Measurements: 3.5 x 2.0 x 4.5 cm. 3 cm hemorrhagic cyst. Other findings No abnormal free fluid. IMPRESSION: Multi fibroid uterus. The largest fibroid measures 2.5 cm. Hemorrhagic cyst of the left ovary. Electronically Signed   By: Andreas Newport M.D.   On:  06/17/2016 05:14    Procedures Procedures (including critical care time)  Medications Ordered in ED Medications  oxyCODONE-acetaminophen (PERCOCET/ROXICET) 5-325 MG per tablet 1 tablet (not administered)  ondansetron (ZOFRAN-ODT) disintegrating tablet 4 mg (not administered)  azithromycin (ZITHROMAX) tablet 1,000 mg (not administered)  cefTRIAXone (ROCEPHIN) injection 250 mg (not administered)     Initial Impression / Assessment and Plan / ED Course  I have reviewed the triage vital signs and the nursing notes.  Pertinent labs & imaging results that were available during my care of the patient were reviewed by me and considered in my medical decision making (see chart for details).  Clinical Course     Pt has hemorrhagic left ovarian cyst and fibroids as well as MANY WBC's and BV. Treated with Azithro and rocephin in ED. Rx: Flagyl and Ultram Referral to [facility]. Clinically no signs of PID or acute abdomen.  Discussed return to ED s/sx  Final Clinical Impressions(s) / ED Diagnoses   Final diagnoses:  BV (bacterial vaginosis)  Uterine leiomyoma, unspecified location  Hemorrhagic cyst of left ovary    New Prescriptions New Prescriptions   METRONIDAZOLE (FLAGYL) 500 MG TABLET    Take 1 tablet (500 mg total) by mouth 2 (two) times daily.   TRAMADOL (ULTRAM) 50 MG TABLET    Take 1 tablet (50 mg total) by mouth every 6 (six) hours as needed.     Delos Haring, PA-C 06/17/16 [place], DO 06/17/16 [phone]

## 2016-10-31 ENCOUNTER — Other Ambulatory Visit (INDEPENDENT_AMBULATORY_CARE_PROVIDER_SITE_OTHER): Payer: Self-pay | Admitting: Otolaryngology

## 2016-10-31 DIAGNOSIS — J329 Chronic sinusitis, unspecified: Secondary | ICD-10-CM

## 2016-11-04 ENCOUNTER — Ambulatory Visit
Admission: RE | Admit: 2016-11-04 | Discharge: 2016-11-04 | Disposition: A | Discharge status: Home / Self Care | Payer: 59 | Source: Ambulatory Visit | Attending: Otolaryngology | Admitting: Otolaryngology

## 2016-11-04 DIAGNOSIS — J329 Chronic sinusitis, unspecified: Secondary | ICD-10-CM

## 2017-01-09 ENCOUNTER — Other Ambulatory Visit (INDEPENDENT_AMBULATORY_CARE_PROVIDER_SITE_OTHER): Payer: Self-pay | Admitting: Otolaryngology

## 2017-01-26 ENCOUNTER — Ambulatory Visit (INDEPENDENT_AMBULATORY_CARE_PROVIDER_SITE_OTHER): Payer: 59 | Admitting: Otolaryngology

## 2017-07-29 IMAGING — CT CT MAXILLOFACIAL W/O CM
2 series · 15 of 30 positions shown, 18 images · non-contrast
Comparison: None.

CLINICAL DATA: 29-year-old female with chronic nasal congestion for
5 years. Completed antibiotics. Initial encounter.

EXAM:
CT MAXILLOFACIAL WITHOUT CONTRAST
TECHNIQUE: Multidetector CT imaging of the maxillofacial structures was
performed. Multiplanar CT image reconstructions were also generated.
A small metallic BB was placed on the right temple in order to
reliably differentiate right from left.

[Series 3: axial soft 1.25 · axial · 0.51mm/px · z∈[-70,+44]mm · 5 of 194 slices shown]
[im 23/194  brain]
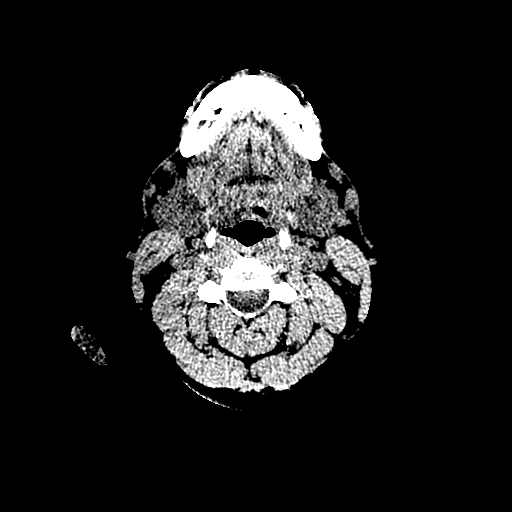
[im 46/194  brain]
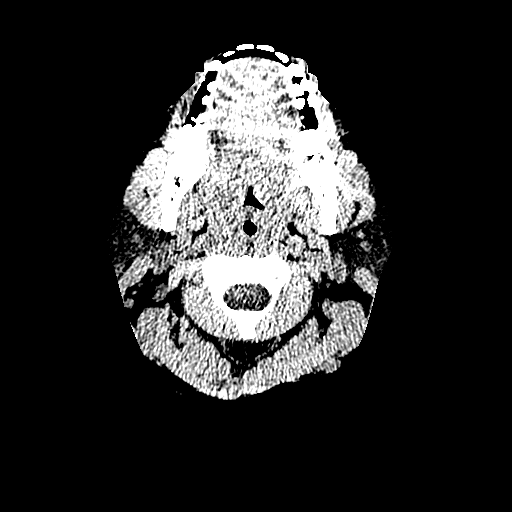
[im 69/194  brain]
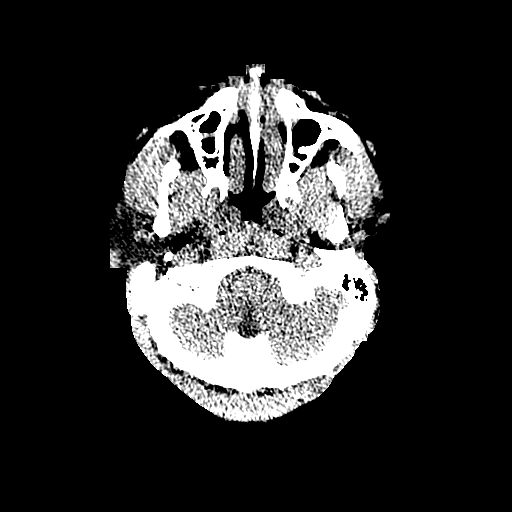
[im 91/194  brain]
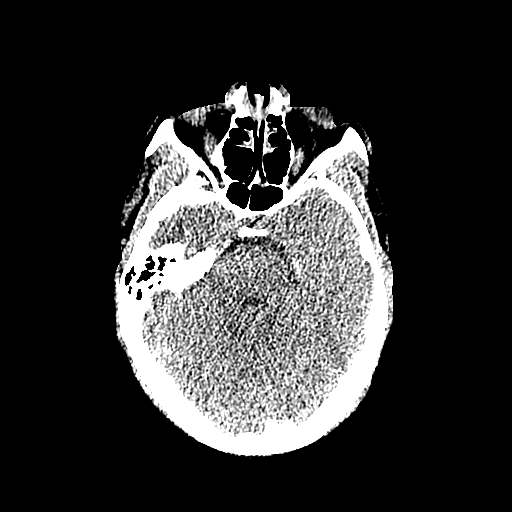
[im 114/194  brain]
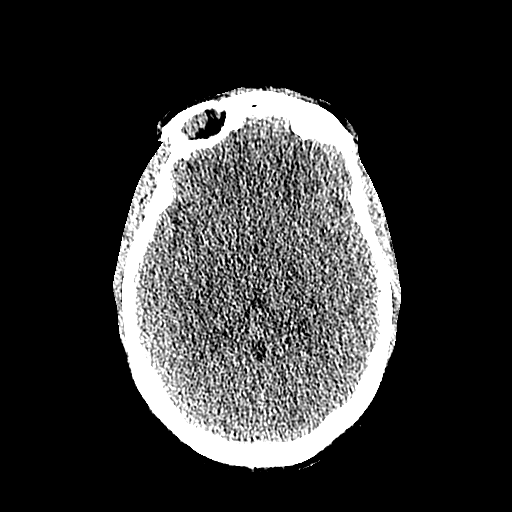

[Series 602: sagittal facial · sagittal · 0.51mm/px · 10 of 131 slices shown, 13 images]
[im 12/131  brain]
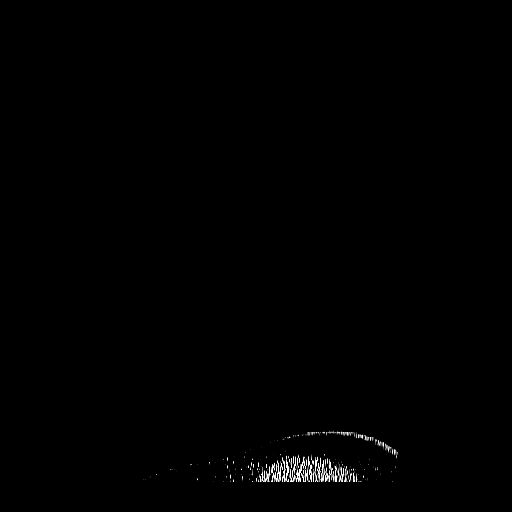
[im 12/131  bone]
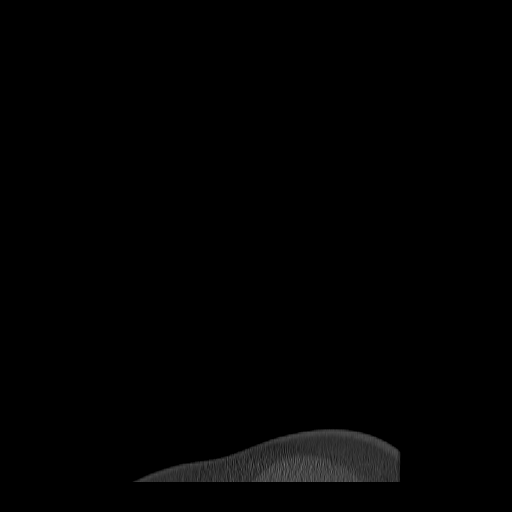
[im 24/131  bone]
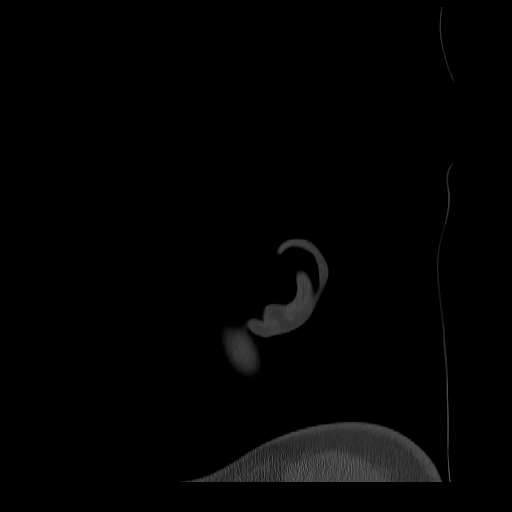
[im 36/131  bone]
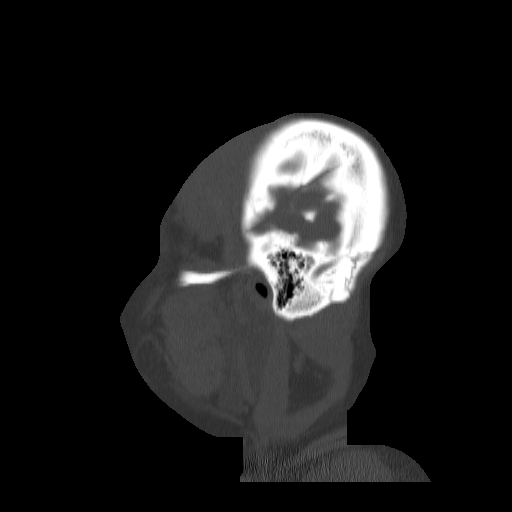
[im 48/131  bone]
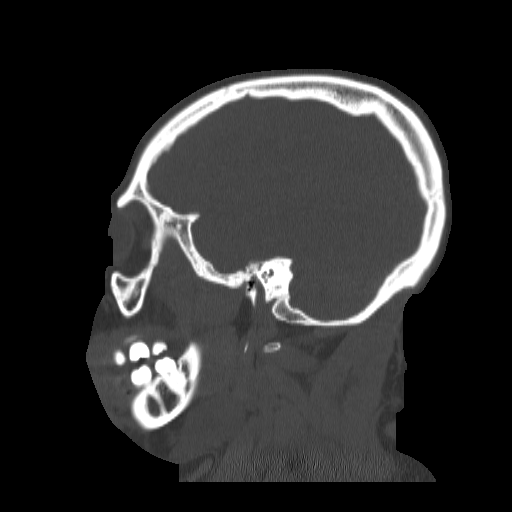
[im 60/131  brain]
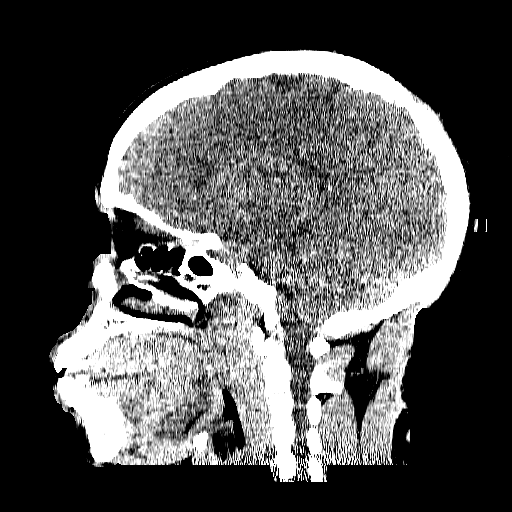
[im 60/131  bone]
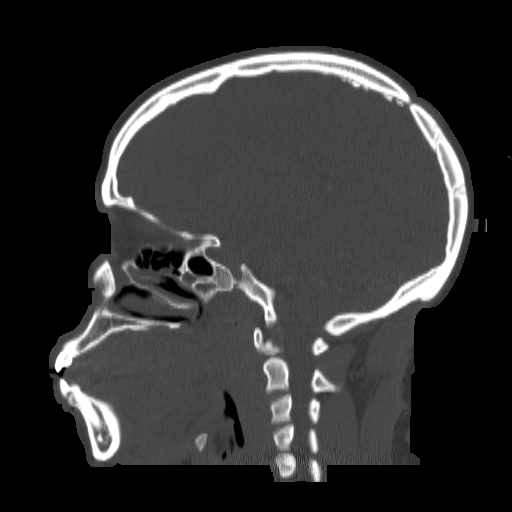
[im 71/131  bone]
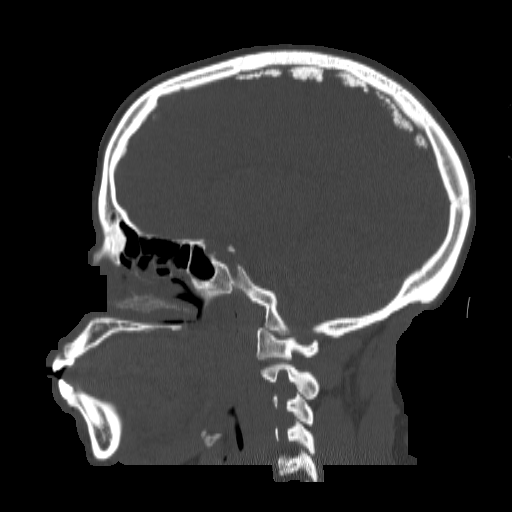
[im 83/131  bone]
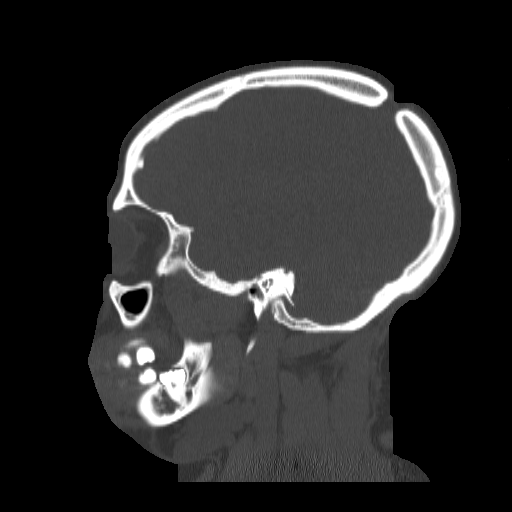
[im 95/131  bone]
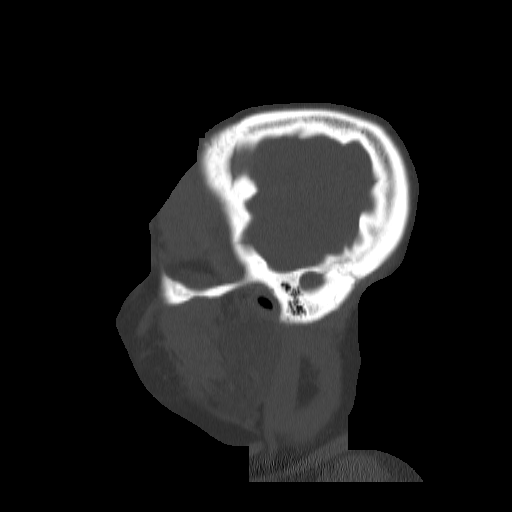
[im 107/131  brain]
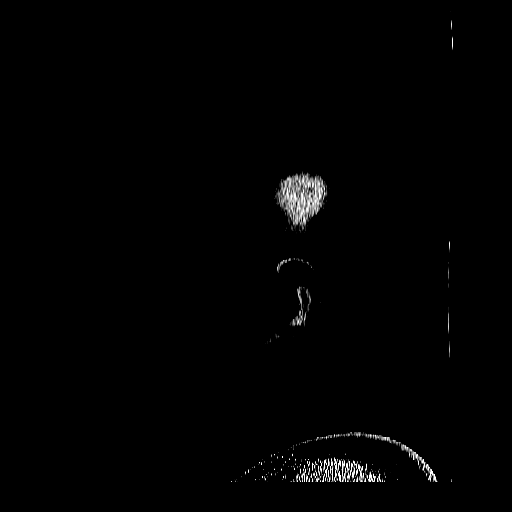
[im 107/131  bone]
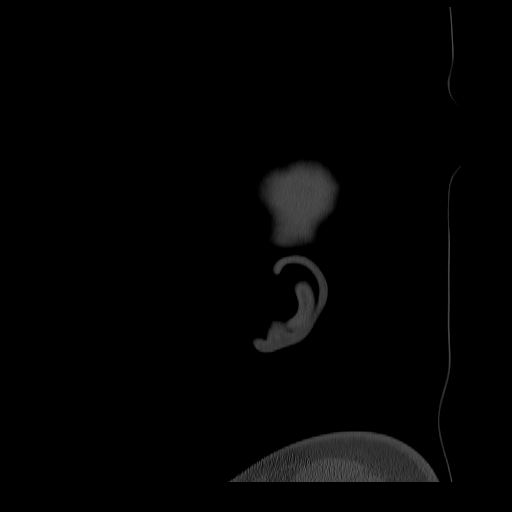
[im 119/131  bone]
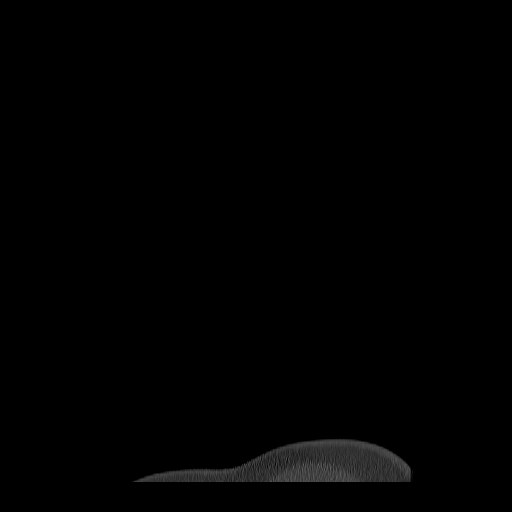

[15 of 30 positions shown; findings below may reference images not displayed]

FINDINGS: Right maxillary sinus mild mucosal thickening measuring up to 4 mm.
Left maxillary sinus minimal mucosal thickening.

Secondary to middle turbinate mucosal thickening and opacification
of the infundibulum bilaterally, poor delineation of bony landmarks
including uncinate process bilaterally.

Centrally located frontal sinus air cell drains to the right with
mucosal thickening fronto-ethmoidal recess.

Moderate mucosal thickening/ opacification anterior right ethmoid
sinus air cell. Otherwise mild mucosal thickening mid ethmoid sinus
air cells bilaterally. Keros 2 configuration bilaterally. Uhl
Martz tilted to the right.

Left sphenoid sinus air cell is dominant. Mucosal thickening
anterior aspect of the sphenoid sinus air cells bilaterally
measuring up to 3 mm. The carotid canal forms border of the
posterior superior wall of the sphenoid sinus with mild projection
into the sphenoid air cells with thin/ dehiscent bony cover. Optic
canal projects along the superior lateral aspect of the sphenoid
sinus.

Mastoid air cells and middle ear cavities are clear bilaterally.

Focal defect parietal calvarium bilaterally may represent prominent
parietal foramen. Alternatively this may be related to prior
surgery.

Prominent ossification of the falx.

Exophthalmos. Symmetric normal appearance of the extra-ocular
muscles.

Evaluation of brain parenchyma limited by technique.
IMPRESSION: Right maxillary sinus mild mucosal thickening measuring up to 4 mm.
Left maxillary sinus minimal mucosal thickening.

Secondary to middle turbinate mucosal thickening and opacification
of the infundibulum bilaterally, poor delineation of bony landmarks
including uncinate process bilaterally.

Centrally located frontal sinus air cell drains to the right with
mucosal thickening fronto-ethmoidal recess.

Moderate mucosal thickening/ opacification anterior right ethmoid
sinus air cell. Otherwise mild mucosal thickening mid ethmoid sinus
air cells bilaterally. Keros 2 configuration bilaterally.

Left sphenoid sinus air cell is dominant. Mucosal thickening
anterior aspect of the sphenoid sinus air cells bilaterally
measuring up to 3 mm. The carotid canal forms border of the
posterior superior wall of the sphenoid sinus with mild projection
into the sphenoid air cells with thin/ dehiscent bony cover. Optic
canal projects along the superior lateral aspect of the sphenoid
sinus.

Focal defect parietal calvarium bilaterally may represent prominent
parietal foramen. Alternatively this may be related to prior
surgery.

Prominent ossification of the falx.
